# Patient Record
Sex: Female | Born: 1995 | Race: White | Hispanic: No | Marital: Single | Smoking: Never smoker
Health system: Southern US, Community
[De-identification: ages and names within clinical notes are randomized; demographics above are authoritative.]

---

## 2020-01-04 ENCOUNTER — Ambulatory Visit (INDEPENDENT_AMBULATORY_CARE_PROVIDER_SITE_OTHER): Payer: BC Managed Care – PPO

## 2020-01-04 ENCOUNTER — Encounter: Payer: Self-pay | Admitting: Podiatry

## 2020-01-04 ENCOUNTER — Ambulatory Visit: Payer: BC Managed Care – PPO | Admitting: Podiatry

## 2020-01-04 ENCOUNTER — Other Ambulatory Visit: Payer: Self-pay

## 2020-01-04 VITALS — BP 127/83 | HR 93 | Temp 97.6°F | Resp 16

## 2020-01-04 DIAGNOSIS — M779 Enthesopathy, unspecified: Secondary | ICD-10-CM | POA: Diagnosis not present

## 2020-01-04 DIAGNOSIS — M24472 Recurrent dislocation, left ankle: Secondary | ICD-10-CM

## 2020-01-04 DIAGNOSIS — M25571 Pain in right ankle and joints of right foot: Secondary | ICD-10-CM | POA: Diagnosis not present

## 2020-01-04 DIAGNOSIS — M25572 Pain in left ankle and joints of left foot: Secondary | ICD-10-CM | POA: Diagnosis not present

## 2020-01-04 DIAGNOSIS — G8929 Other chronic pain: Secondary | ICD-10-CM

## 2020-01-04 NOTE — Progress Notes (Signed)
   Subjective:    Patient ID: Alexis Harrison, female    DOB: 06-29-1996, 24 y.o.   MRN: [id number]  HPI 24 year old female presents the office today for concerns of popping sensation to her ankles with the left side worse than right which are starting to cause discomfort.  She states that happens on a daily basis.  She denies any recent injury.  She is not sure how long this is been ongoing for her.  She said no recent treatment.  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  History reviewed. No pertinent past medical history.  History reviewed. No pertinent surgical history.  No current outpatient medications on file.  No Known Allergies       Objective:   Physical Exam General: AAO x3, NAD  Dermatological: Skin is warm, dry and supple bilateral. Nails x 10 are well manicured; remaining integument appears unremarkable at this time. There are no open sores, no preulcerative lesions, no rash or signs of infection present.  Vascular: Dorsalis Pedis artery and Posterior Tibial artery pedal pulses are 2/4 bilateral with immedate capillary fill time. There is no pain with calf compression, swelling, warmth, erythema.   Neruologic: Grossly intact via light touch bilateral. Protective threshold with Semmes Wienstein monofilament intact to all pedal sites bilateral.   Musculoskeletal: There is no tenderness palpation the course the peroneal tendons for the peroneal tendons appear to be sitting in the appropriate anatomical position.  Upon weightbearing evaluation gait evaluation there is a popping sensation mostly when she rotated and turned to walk back towards me.  Muscular strength 5/5 in all groups tested bilateral.  Gait: Unassisted, Nonantalgic.        Assessment & Plan:   Concern for peroneal subluxation L>>R  -Treatment options discussed including all alternatives, risks, and complications -Etiology of  symptoms were discussed -X-rays ordered and independently reviewed by myself.  Noted to fleck fracture or other pathology.  Awaiting radiology review she came back to the office and reviewed these with her. -I want to get her a pair of power steps to see if this will help as she walks.  MRI left ankle to further evaluate the fibular groove as well as for peroneal subluxation.  Return for ankle pain after x-ray and possible MRI.  Vivi Barrack DPM

## 2020-01-07 ENCOUNTER — Telehealth: Payer: Self-pay | Admitting: *Deleted

## 2020-01-07 DIAGNOSIS — M779 Enthesopathy, unspecified: Secondary | ICD-10-CM

## 2020-01-07 DIAGNOSIS — M24472 Recurrent dislocation, left ankle: Secondary | ICD-10-CM

## 2020-01-07 NOTE — Telephone Encounter (Signed)
-----   Message from Vivi Barrack, DPM sent at 01/06/2020  2:50 PM EDT ----- Can you please order an MRI of the left ankle to evaluate the peroneal tendon to rule out partial tear as well as for peroneal subluxation/shallow fibular groove? Thanks.

## 2020-01-07 NOTE — Telephone Encounter (Signed)
Orders to L. Cox, CMA for pre-cert and faxed to [facility]. 

## 2020-01-09 ENCOUNTER — Telehealth: Payer: Self-pay | Admitting: *Deleted

## 2020-01-09 NOTE — Telephone Encounter (Signed)
Called and spoke with [name] from [employer] at [phone] and the procedure code 04888 needs prior authorization and was denied and needs a peer to peer from the doctor and the number is [phone] and I am faxing over the chart notes as well to nurse reviewer at [phone] and needs to have the name and ID and date of birth and the case number is [id number] and I faxed over the notes today. Alexis Harrison

## 2020-01-10 NOTE — Telephone Encounter (Signed)
Dr Ardelle Anton had to do a peer to peer today and spoke with Trula Ore from [employer] [facility] and got the authorization number [id number] and is valid 12-30-2019 through 04-07-2020. Misty Stanley

## 2020-01-15 NOTE — Telephone Encounter (Signed)
[employer] Heloise Beecham AUTHORIZATION:  [id number] FOR MRI 32023 LEFT ANKLE, VALID 01/09/2020 - Apr 08, 2020.

## 2020-02-02 ENCOUNTER — Other Ambulatory Visit: Payer: BC Managed Care – PPO

## 2021-07-23 IMAGING — DX DG ANKLE COMPLETE 3+V*R*
4 series · 4 of 4 positions shown · non-contrast
Comparison: None.

CLINICAL DATA: Bilateral ankle pain

EXAM:
RIGHT ANKLE - COMPLETE 3+ VIEW; LEFT ANKLE COMPLETE - 3+ VIEW

[ankle ap]
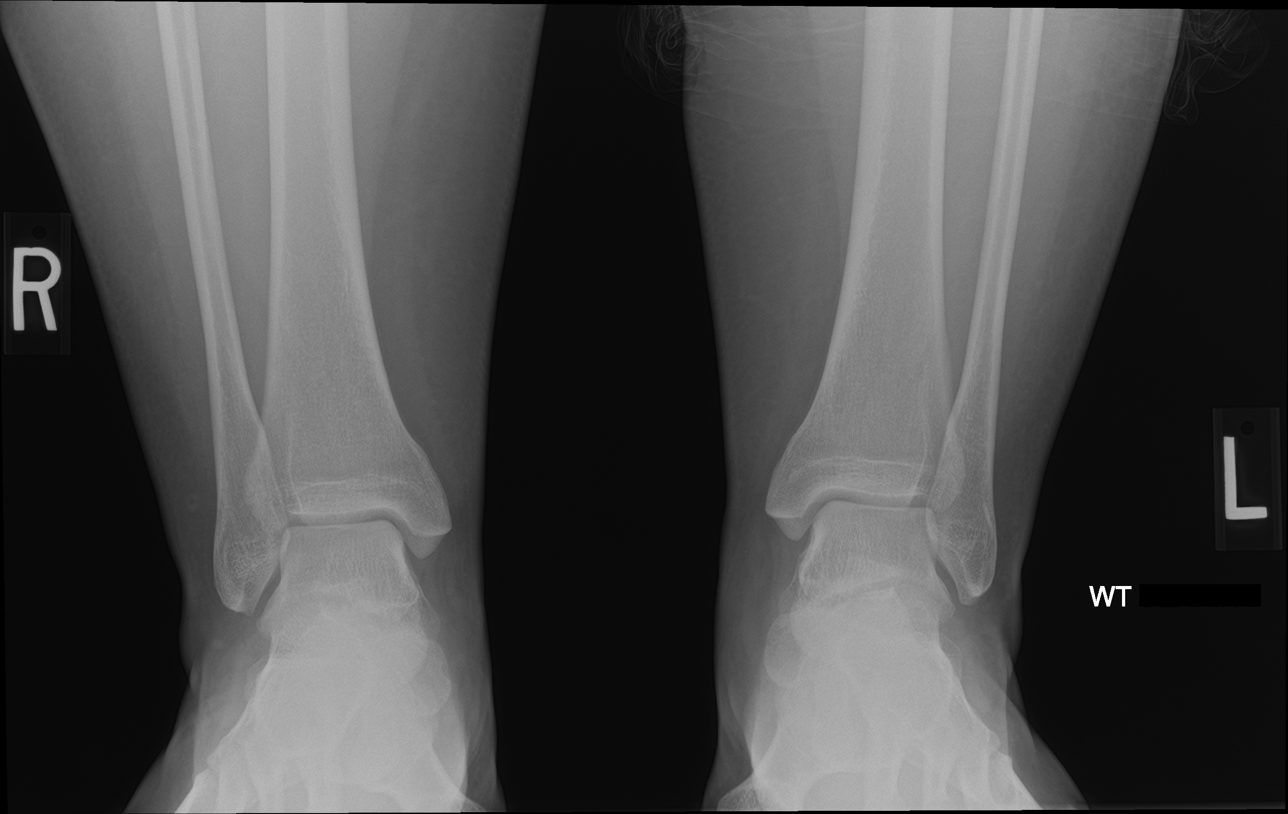

[ankle obl]
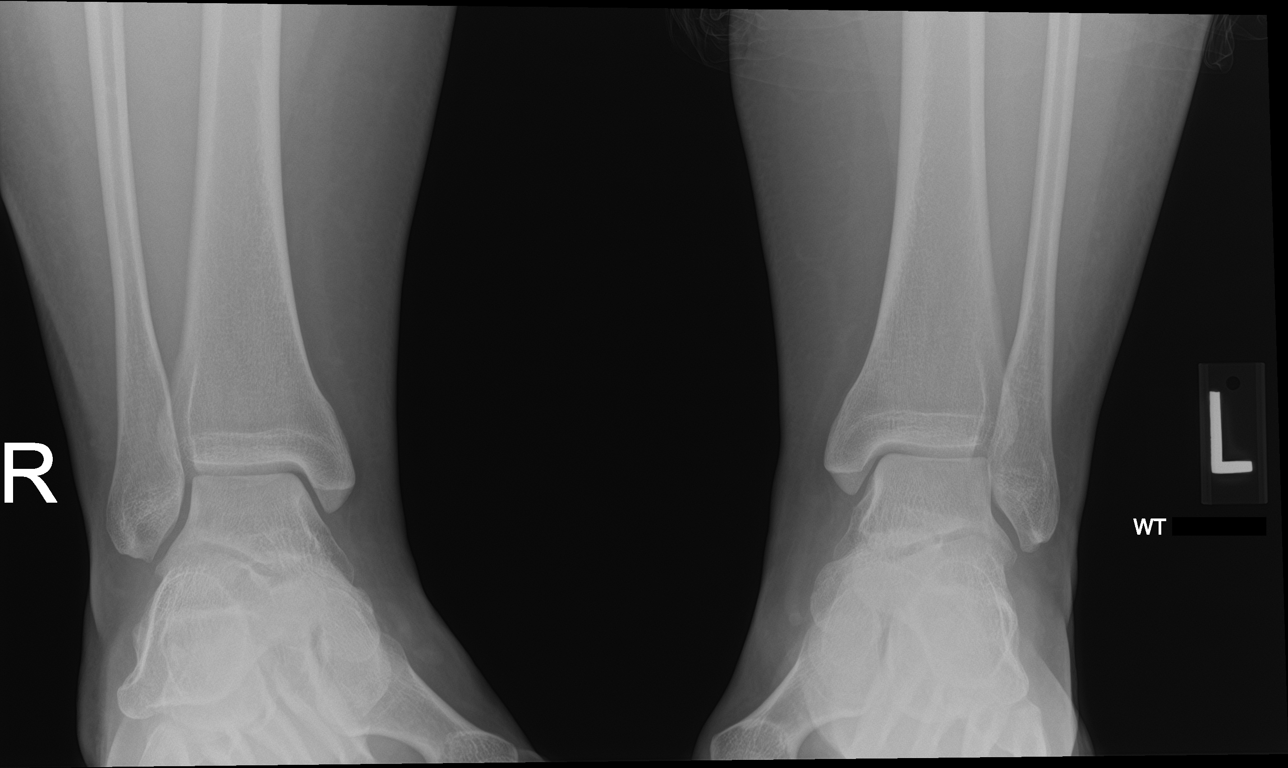

[ankle lat (1 of 2)]
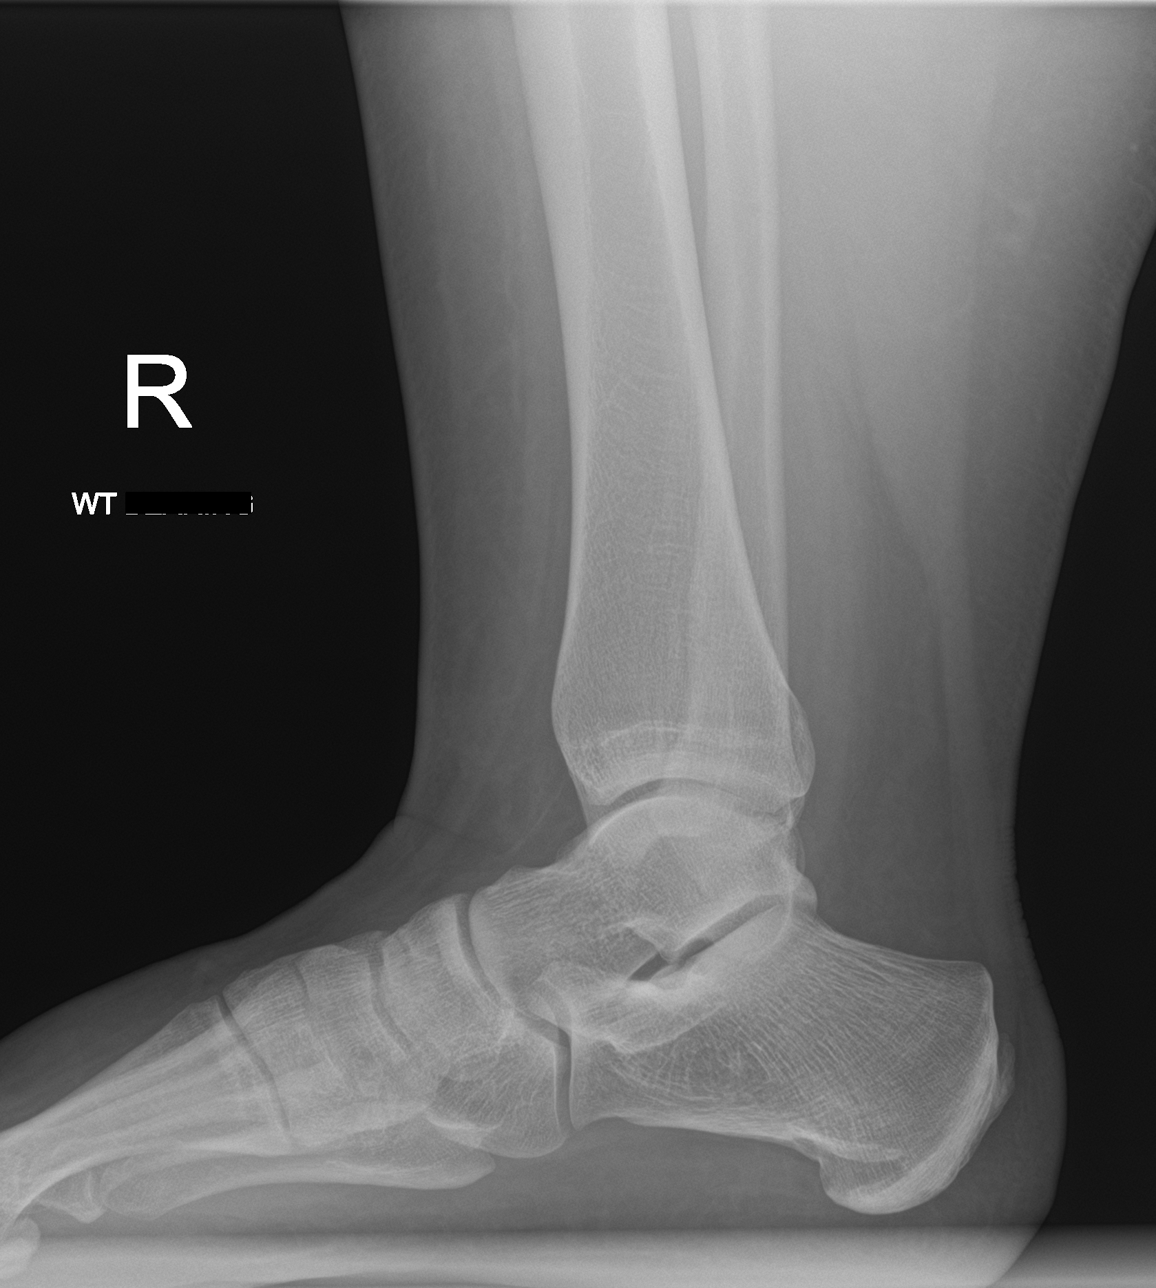

[ankle lat (2 of 2)]
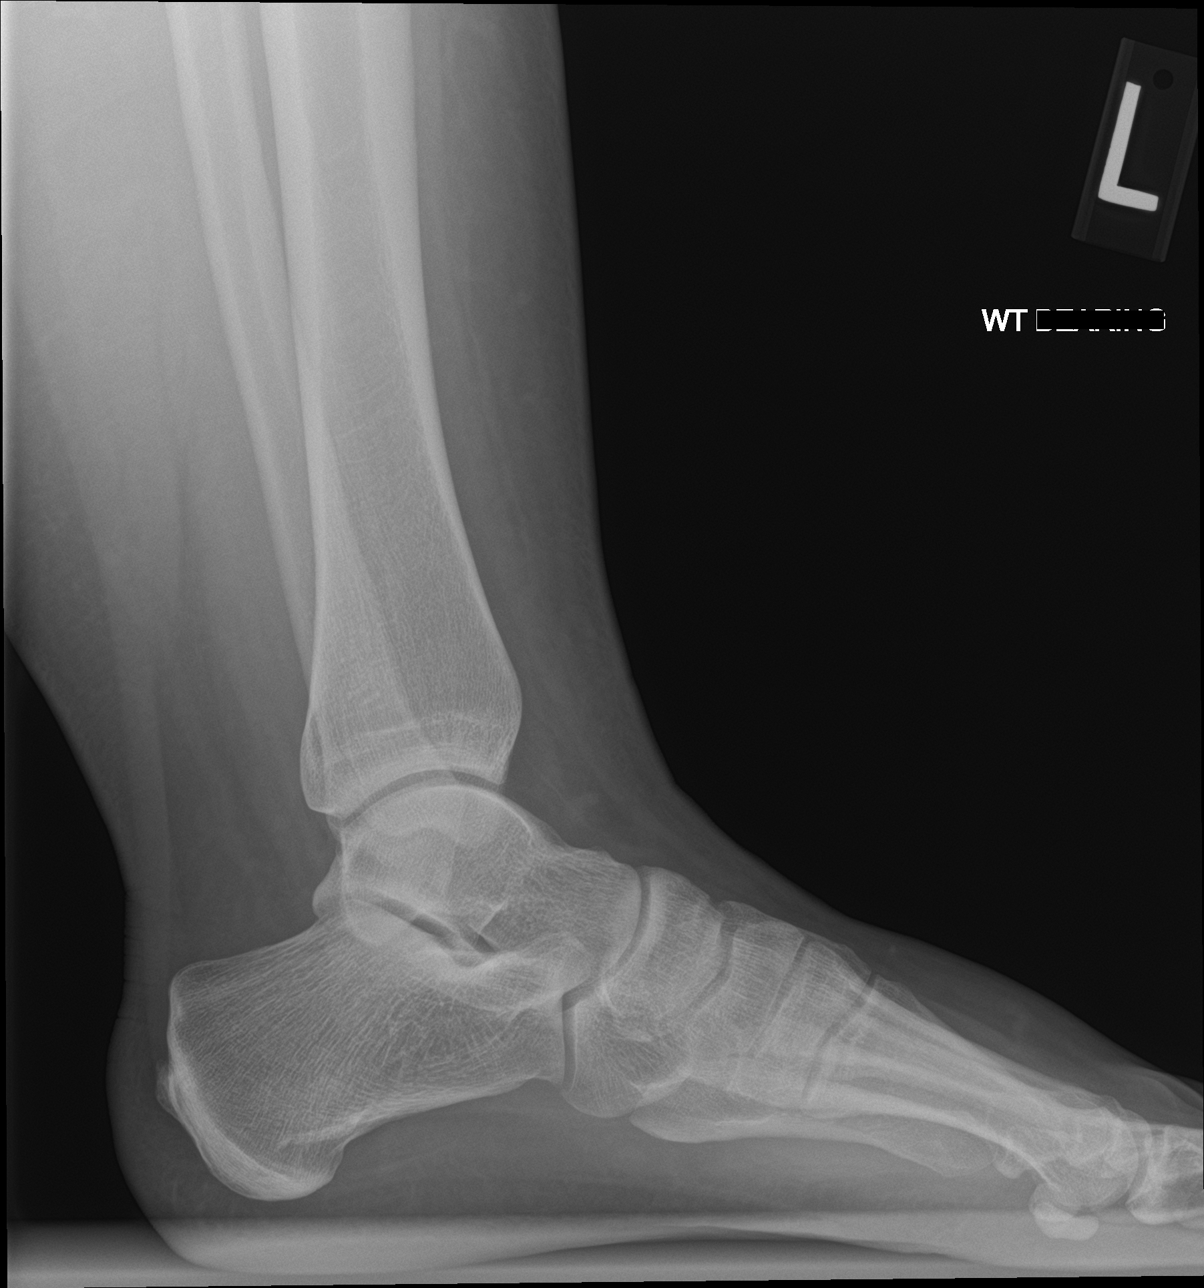

[4 of 4 positions shown; findings below may reference images not displayed]

FINDINGS: Bones: No fracture or dislocation. Normal bone mineralization. No
periostitis. Mild enthesopathic changes at the left Achilles tendon
insertion.

Joints: Normal alignment. No erosive changes. Joint spaces are
maintained.

Soft tissue: No soft tissue abnormality. No radiopaque foreign body.
No chondrocalcinosis.
IMPRESSION: No significant arthropathy of bilateral ankles.

## 2021-09-17 ENCOUNTER — Emergency Department (INDEPENDENT_AMBULATORY_CARE_PROVIDER_SITE_OTHER)
Admission: RE | Admit: 2021-09-17 | Discharge: 2021-09-17 | Disposition: A | Discharge status: Home / Self Care | Payer: 59 | Source: Ambulatory Visit | Attending: Family Medicine | Admitting: Family Medicine

## 2021-09-17 ENCOUNTER — Other Ambulatory Visit: Payer: Self-pay

## 2021-09-17 VITALS — BP 136/81 | HR 108 | Temp 101.2°F | Resp 20 | Ht 64.0 in | Wt 275.0 lb

## 2021-09-17 DIAGNOSIS — R509 Fever, unspecified: Secondary | ICD-10-CM

## 2021-09-17 DIAGNOSIS — J101 Influenza due to other identified influenza virus with other respiratory manifestations: Secondary | ICD-10-CM

## 2021-09-17 LAB — POCT INFLUENZA A/B
Influenza A, POC: POSITIVE — AB
Influenza B, POC: NEGATIVE

## 2021-09-17 MED ORDER — ACETAMINOPHEN 500 MG PO TABS
1000.0000 mg | ORAL_TABLET | Freq: Once | ORAL | Status: AC
  Administered 2021-09-17: 1000 mg via ORAL

## 2021-09-17 MED ORDER — OSELTAMIVIR PHOSPHATE 75 MG PO CAPS: 75.0000 mg | ORAL_CAPSULE | Freq: Two times a day (BID) | ORAL | 0 refills | Status: AC

## 2021-09-17 NOTE — Discharge Instructions (Signed)
Take plain guaifenesin (1200mg  extended release tabs such as Mucinex) twice daily, with plenty of water, for cough and congestion.  May add Pseudoephedrine (30mg , one or two every 4 to 6 hours) for sinus congestion.  Get adequate rest.   Also recommend using saline nasal spray several times daily and saline nasal irrigation (AYR is a common brand).    Try warm salt water gargles for sore throat.  May take Delsym Cough Suppressant ("12 Hour Cough Relief") at bedtime for nighttime cough.  Stop all antihistamines for now, and other non-prescription cough/cold preparations. May take Tylenol as needed for body aches, fever, etc.

## 2021-09-17 NOTE — ED Triage Notes (Signed)
Sick one month ago with URI, went away, yesterday, dry cough, fever, 101.5, wheezing, loss of appetite, chills, body aches. Vaccinated for Covid-Neg Covid test No Flu

## 2021-09-17 NOTE — ED Provider Notes (Signed)
[facility]    CSN: [id number] Arrival date & time: 09/17/21  1857      History   Chief Complaint Chief Complaint  Patient presents with   Sore Throat    HPI Alexis Harrison is a 25 y.o. female.   Yesterday patient suddenly developed a non-productive cough, mild sinus congestion, fatigue, myalgias, wheezing, chills and fever to 101.5.  She denies sore throat, nausea/vomiting, shortness of breath, and pleuritic pain.  She had a negative home COVID19 test  The history is provided by the patient and a parent.   History reviewed. No pertinent past medical history.  There are no problems to display for this patient.   History reviewed. No pertinent surgical history.  OB History   No obstetric history on file.      Home Medications    Prior to Admission medications   Medication Sig Start Date End Date Taking? Authorizing Provider  acetaminophen (TYLENOL) 325 MG tablet Take 650 mg by mouth every 6 (six) hours as needed.   Yes [provider]  Adalimumab (HUMIRA) 20 MG/0.2ML PSKT Inject into the skin.   Yes [provider]  doxycycline (VIBRA-TABS) 100 MG tablet Take 100 mg by mouth 2 (two) times daily.   Yes [provider]  levonorgestrel (MIRENA) 20 MCG/DAY IUD 1 each by Intrauterine route once.   Yes [provider]  oseltamivir (TAMIFLU) 75 MG capsule Take 1 capsule (75 mg total) by mouth 2 (two) times daily for 5 days. 09/17/21 09/22/21 Yes Lattie Haw, MD  SENNA-FENNEL PO Take by mouth.   Yes [provider]    Family History Family History  Problem Relation Age of Onset   Colitis Mother    Insomnia Mother    Osteoarthritis Mother    Hypertension Father    Gout Father     Social History Social History   Tobacco Use   Smoking status: Never   Smokeless tobacco: Never  Vaping Use   Vaping Use: Never used  Substance Use Topics   Alcohol use: Not Currently   Drug use: Not Currently      Allergies   Patient has no known allergies.   Review of Systems Review of Systems No sore throat + cough No pleuritic pain + wheezing + nasal congestion + post-nasal drainage No sinus pain/pressure No itchy/red eyes No earache No hemoptysis No SOB + fever, + chills No nausea No vomiting No abdominal pain No diarrhea No urinary symptoms No skin rash + fatigue + myalgias No headache Used OTC meds (Tylenol) without relief   Physical Exam Triage Vital Signs ED Triage Vitals  Enc Vitals Group     BP 09/17/21 1926 136/81     Pulse Rate 09/17/21 1926 (!) 108     Resp 09/17/21 1926 20     Temp 09/17/21 1926 (!) 101.2 F (38.4 C)     Temp Source 09/17/21 1926 Oral     SpO2 09/17/21 1926 100 %     Weight 09/17/21 1927 275 lb (124.7 kg)     Height 09/17/21 1927 5\' 4"  (1.626 m)     Head Circumference --      Peak Flow --      Pain Score 09/17/21 1926 6     Pain Loc --      Pain Edu? --      Excl. in GC? --    No data found.  Updated Vital Signs BP 136/81 (BP Location: Left Arm)  Pulse (!) 108   Temp (!) 101.2 F (38.4 C) (Oral)   Resp 20   Ht 5\' 4"  (1.626 m)   Wt 124.7 kg   SpO2 100%   BMI 47.20 kg/m   Visual Acuity Right Eye Distance:   Left Eye Distance:   Bilateral Distance:    Right Eye Near:   Left Eye Near:    Bilateral Near:     Physical Exam Nursing notes and Vital Signs reviewed. Appearance:  Patient appears stated age, and in no acute distress Eyes:  Pupils are equal, round, and reactive to light and accomodation.  Extraocular movement is intact.  Conjunctivae are not inflamed  Ears:  Canals normal.  Tympanic membranes normal.  Nose:  Mildly congested turbinates.  No sinus tenderness.   Pharynx:  Normal Neck:  Supple.  Mildly enlarged lateral nodes are present, tender to palpation on the left.   Lungs:  Clear to auscultation.  Breath sounds are equal.  Moving air well. Heart:  Regular rate and rhythm without murmurs, rubs, or  gallops.  Abdomen:  Nontender without masses or hepatosplenomegaly.  Bowel sounds are present.  No CVA or flank tenderness.  Extremities:  No edema.  Skin:  No rash present.   UC Treatments / Results  Labs (all labs ordered are listed, but only abnormal results are displayed) Labs Reviewed  POCT INFLUENZA A/B - Abnormal; Notable for the following components:      Result Value   Influenza A, POC Positive (*)    All other components within normal limits    EKG   Radiology No results found.  Procedures Procedures (including critical care time)  Medications Ordered in UC Medications  acetaminophen (TYLENOL) tablet 1,000 mg (1,000 mg Oral Given 09/17/21 1952)    Initial Impression / Assessment and Plan / UC Course  I have reviewed the triage vital signs and the nursing notes.  Pertinent labs & imaging results that were available during my care of the patient were reviewed by me and considered in my medical decision making (see chart for details).    Begin Tamiflu. Followup with Family Doctor if not improved in five days. If symptoms become significantly worse during the night or over the weekend, proceed to the local emergency room.   Final Clinical Impressions(s) / UC Diagnoses   Final diagnoses:  Fever, unspecified  Influenza A     Discharge Instructions      Take plain guaifenesin (1200mg  extended release tabs such as Mucinex) twice daily, with plenty of water, for cough and congestion.  May add Pseudoephedrine (30mg , one or two every 4 to 6 hours) for sinus congestion.  Get adequate rest.   Also recommend using saline nasal spray several times daily and saline nasal irrigation (AYR is a common brand).    Try warm salt water gargles for sore throat.  May take Delsym Cough Suppressant ("12 Hour Cough Relief") at bedtime for nighttime cough.  Stop all antihistamines for now, and other non-prescription cough/cold preparations. May take Tylenol as needed for body  aches, fever, etc.      ED Prescriptions     Medication Sig Dispense Auth. Provider   oseltamivir (TAMIFLU) 75 MG capsule Take 1 capsule (75 mg total) by mouth 2 (two) times daily for 5 days. 10 capsule Kandra Nicolas, MD         Kandra Nicolas, MD 09/18/21 1435
